# Patient Record
Sex: Female | Born: 1960 | Race: White | Hispanic: No | Marital: Single | Smoking: Never smoker
Health system: Southern US, Community
[De-identification: ages and names within clinical notes are randomized; demographics above are authoritative.]

## PROBLEM LIST (undated history)

## (undated) HISTORY — PX: AUGMENTATION MAMMAPLASTY: SUR837

---

## 2016-12-11 DIAGNOSIS — N952 Postmenopausal atrophic vaginitis: Secondary | ICD-10-CM | POA: Diagnosis not present

## 2016-12-11 DIAGNOSIS — R5383 Other fatigue: Secondary | ICD-10-CM | POA: Diagnosis not present

## 2016-12-18 DIAGNOSIS — E875 Hyperkalemia: Secondary | ICD-10-CM | POA: Diagnosis not present

## 2016-12-22 DIAGNOSIS — R0789 Other chest pain: Secondary | ICD-10-CM | POA: Diagnosis not present

## 2016-12-22 DIAGNOSIS — R0602 Shortness of breath: Secondary | ICD-10-CM | POA: Diagnosis not present

## 2016-12-22 DIAGNOSIS — M159 Polyosteoarthritis, unspecified: Secondary | ICD-10-CM | POA: Diagnosis not present

## 2016-12-26 DIAGNOSIS — R0789 Other chest pain: Secondary | ICD-10-CM | POA: Diagnosis not present

## 2016-12-26 DIAGNOSIS — R0602 Shortness of breath: Secondary | ICD-10-CM | POA: Diagnosis not present

## 2017-01-02 DIAGNOSIS — Z7689 Persons encountering health services in other specified circumstances: Secondary | ICD-10-CM | POA: Diagnosis not present

## 2017-01-02 DIAGNOSIS — R9439 Abnormal result of other cardiovascular function study: Secondary | ICD-10-CM | POA: Diagnosis not present

## 2017-01-07 DIAGNOSIS — R0602 Shortness of breath: Secondary | ICD-10-CM | POA: Diagnosis not present

## 2017-01-07 DIAGNOSIS — R5381 Other malaise: Secondary | ICD-10-CM | POA: Diagnosis not present

## 2017-01-07 DIAGNOSIS — R0789 Other chest pain: Secondary | ICD-10-CM | POA: Diagnosis not present

## 2017-05-29 DIAGNOSIS — M2550 Pain in unspecified joint: Secondary | ICD-10-CM | POA: Diagnosis not present

## 2017-05-29 DIAGNOSIS — R1013 Epigastric pain: Secondary | ICD-10-CM | POA: Diagnosis not present

## 2017-05-29 DIAGNOSIS — K219 Gastro-esophageal reflux disease without esophagitis: Secondary | ICD-10-CM | POA: Diagnosis not present

## 2017-05-29 DIAGNOSIS — R5383 Other fatigue: Secondary | ICD-10-CM | POA: Diagnosis not present

## 2017-06-04 DIAGNOSIS — S8002XA Contusion of left knee, initial encounter: Secondary | ICD-10-CM | POA: Diagnosis not present

## 2017-07-01 DIAGNOSIS — K219 Gastro-esophageal reflux disease without esophagitis: Secondary | ICD-10-CM | POA: Diagnosis not present

## 2017-07-01 DIAGNOSIS — E559 Vitamin D deficiency, unspecified: Secondary | ICD-10-CM | POA: Diagnosis not present

## 2017-08-12 DIAGNOSIS — H5789 Other specified disorders of eye and adnexa: Secondary | ICD-10-CM | POA: Diagnosis not present

## 2017-09-14 DIAGNOSIS — K219 Gastro-esophageal reflux disease without esophagitis: Secondary | ICD-10-CM | POA: Diagnosis not present

## 2017-09-14 DIAGNOSIS — Z23 Encounter for immunization: Secondary | ICD-10-CM | POA: Diagnosis not present

## 2017-09-14 DIAGNOSIS — E559 Vitamin D deficiency, unspecified: Secondary | ICD-10-CM | POA: Diagnosis not present

## 2018-04-12 ENCOUNTER — Other Ambulatory Visit: Payer: Self-pay | Admitting: Orthopedic Surgery

## 2018-04-12 DIAGNOSIS — M79672 Pain in left foot: Secondary | ICD-10-CM

## 2018-11-09 ENCOUNTER — Other Ambulatory Visit: Payer: Self-pay | Admitting: Family Medicine

## 2018-11-09 DIAGNOSIS — Z1231 Encounter for screening mammogram for malignant neoplasm of breast: Secondary | ICD-10-CM

## 2018-12-11 ENCOUNTER — Ambulatory Visit
Admission: RE | Admit: 2018-12-11 | Discharge: 2018-12-11 | Disposition: A | Discharge status: Home / Self Care | Payer: 59 | Source: Ambulatory Visit | Attending: Family Medicine | Admitting: Family Medicine

## 2018-12-11 ENCOUNTER — Other Ambulatory Visit: Payer: Self-pay

## 2018-12-11 DIAGNOSIS — Z1231 Encounter for screening mammogram for malignant neoplasm of breast: Secondary | ICD-10-CM

## 2019-01-25 ENCOUNTER — Encounter: Payer: 59 | Admitting: Plastic Surgery

## 2019-03-07 ENCOUNTER — Other Ambulatory Visit: Payer: Self-pay

## 2019-03-07 DIAGNOSIS — Z20822 Contact with and (suspected) exposure to covid-19: Secondary | ICD-10-CM

## 2019-03-08 LAB — NOVEL CORONAVIRUS, NAA: SARS-CoV-2, NAA: NOT DETECTED

## 2019-04-15 ENCOUNTER — Encounter: Payer: Self-pay | Admitting: Plastic Surgery

## 2019-06-02 ENCOUNTER — Ambulatory Visit (INDEPENDENT_AMBULATORY_CARE_PROVIDER_SITE_OTHER): Payer: Self-pay | Admitting: Plastic Surgery

## 2019-06-02 ENCOUNTER — Encounter

## 2019-06-02 ENCOUNTER — Other Ambulatory Visit: Payer: Self-pay

## 2019-06-02 ENCOUNTER — Encounter: Payer: Self-pay | Admitting: Plastic Surgery

## 2019-06-02 VITALS — BP 171/102 | HR 86 | Temp 97.7°F | Ht 62.0 in | Wt 161.0 lb

## 2019-06-02 DIAGNOSIS — Z411 Encounter for cosmetic surgery: Secondary | ICD-10-CM

## 2019-06-02 NOTE — Progress Notes (Signed)
Patient presents to be evaluated for Botox.  She is interested in Botox for the glabella the forehead and the crows feet and has both static and dynamic righted's in all these areas.  She has had Botox before and was happy with the results.  She said it lasted her about 3 months.  She is also concerned about her lower lip fullness.  40 units of Botox were used for injection today.  20 units were used for the corrugators and procerus 10 units were used in the central half of the forehead and 10 units were split between the crows feet.  She tolerated this well.  For the lower lip we discussed Cline Crock which she is going to think about.  I asked her to make appointment for 2 weeks in the event that she feels she needs any touchups.  Botox lot number is [id number] Expiration is 8/23

## 2019-06-16 ENCOUNTER — Other Ambulatory Visit: Payer: Self-pay

## 2019-06-16 ENCOUNTER — Ambulatory Visit (INDEPENDENT_AMBULATORY_CARE_PROVIDER_SITE_OTHER): Payer: Self-pay | Admitting: Plastic Surgery

## 2019-06-16 VITALS — BP 159/100 | HR 88 | Temp 97.6°F

## 2019-06-16 DIAGNOSIS — Z411 Encounter for cosmetic surgery: Secondary | ICD-10-CM

## 2019-06-16 NOTE — Progress Notes (Signed)
Patient presents for follow-up visit after getting Botox 2 weeks ago.  She is happy with her central forehead and glabella area.  She would like more Botox laterally because she does not like the arched appearance of her brow or the wrinkles that appear laterally when she activates the muscle.  She additionally would like a little bit more around her crows feet. 16 units of Botox were used today.  About 4 units were injected for the frontalis laterally on both sides and 4 units were injected on each side for the crows feet.  She tolerated this well.  She knows to call the office should she have any further concerns. Lot number for the Botox is [id number] 1 2 Expiration August 2023

## 2019-07-13 ENCOUNTER — Ambulatory Visit: Payer: 59 | Attending: Internal Medicine

## 2019-07-13 DIAGNOSIS — Z20822 Contact with and (suspected) exposure to covid-19: Secondary | ICD-10-CM

## 2019-07-14 LAB — NOVEL CORONAVIRUS, NAA: SARS-CoV-2, NAA: NOT DETECTED

## 2019-08-10 ENCOUNTER — Ambulatory Visit: Payer: 59 | Attending: Internal Medicine

## 2019-08-10 DIAGNOSIS — Z20822 Contact with and (suspected) exposure to covid-19: Secondary | ICD-10-CM

## 2019-08-11 LAB — NOVEL CORONAVIRUS, NAA: SARS-CoV-2, NAA: NOT DETECTED

## 2020-06-13 IMAGING — MG DIGITAL SCREENING BILATERAL MAMMOGRAM WITH IMPLANTS, CAD AND TOM
8 of 12 series · 8 of 28 positions shown · non-contrast
Comparison: Previous exam(s).

CLINICAL DATA: Screening.

EXAM:
DIGITAL SCREENING BILATERAL MAMMOGRAM WITH IMPLANTS, CAD AND TOMO
The patient has bilateral subpectoral saline implants. Standard and
implant displaced views were performed.

[L MLO]
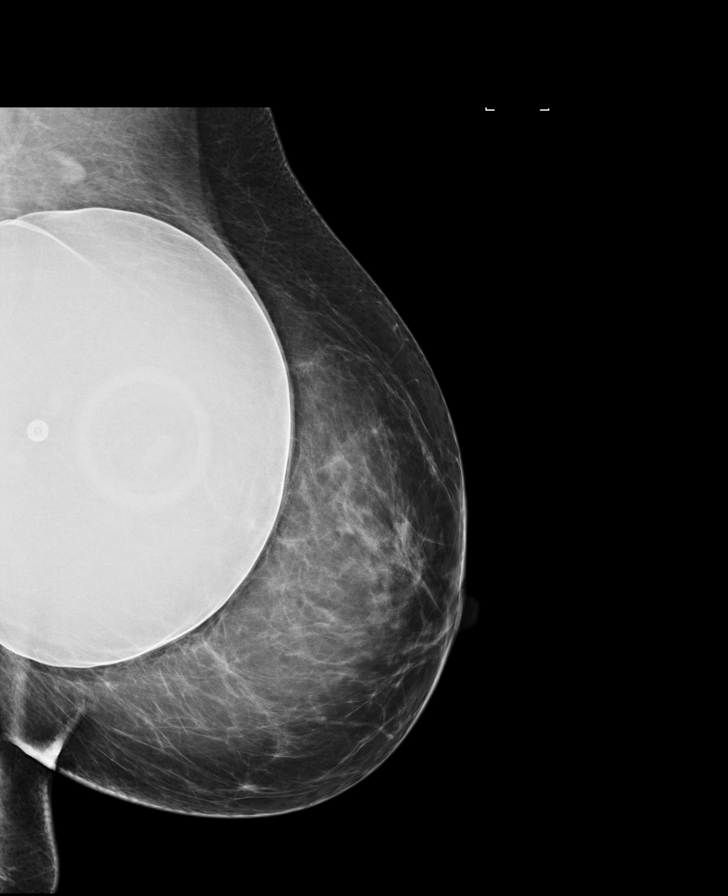

[L CC]
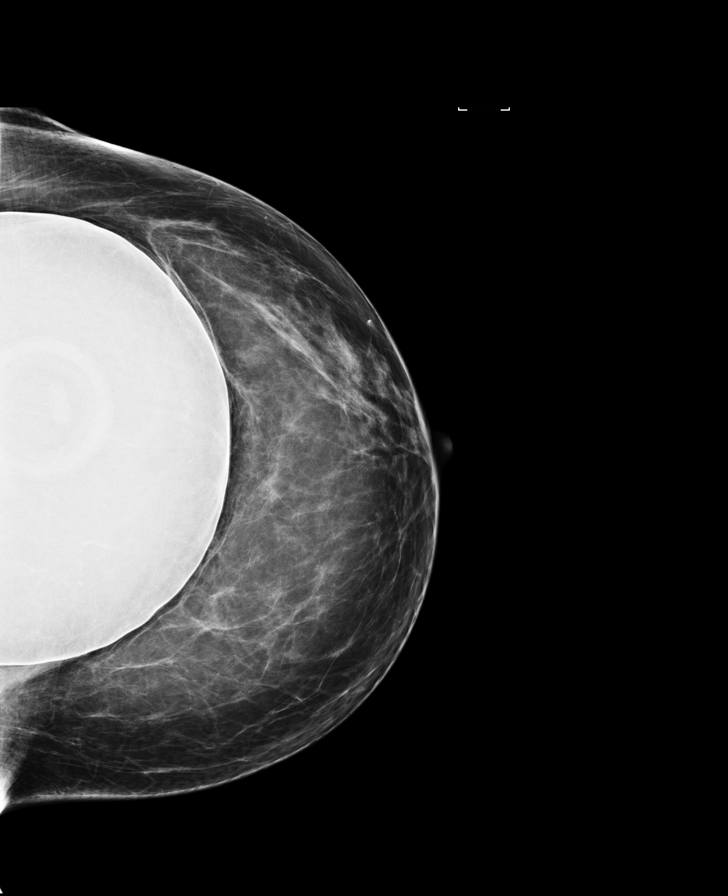

[R MLO]
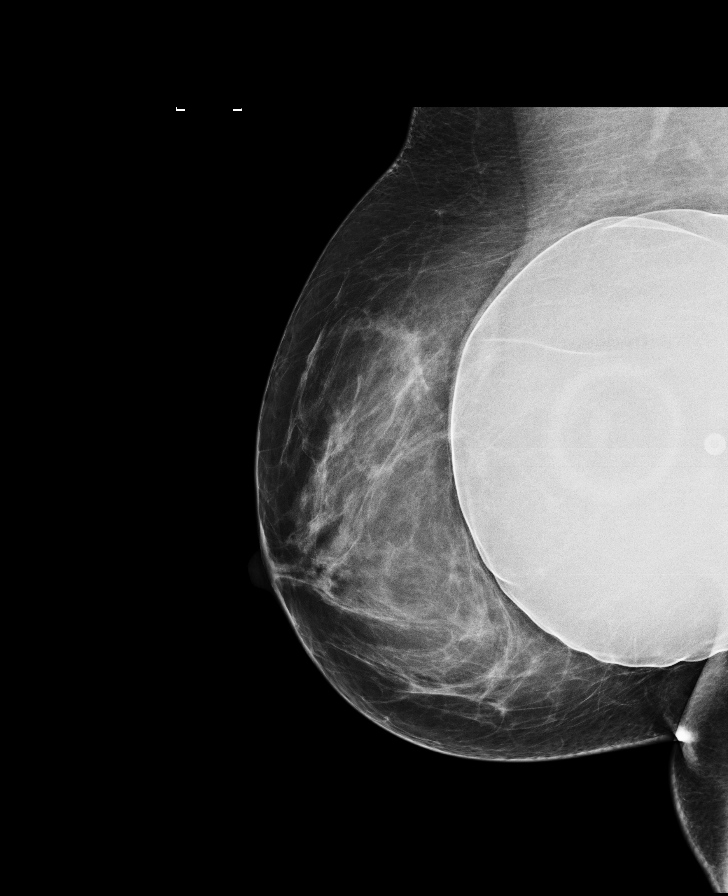

[R CC]
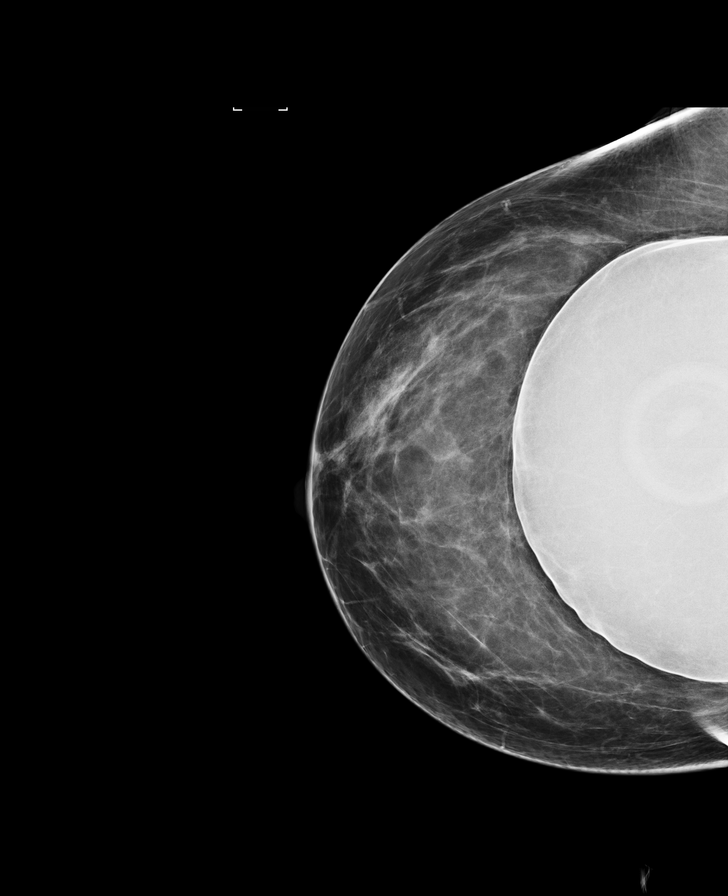

[R MLO synth-2D]
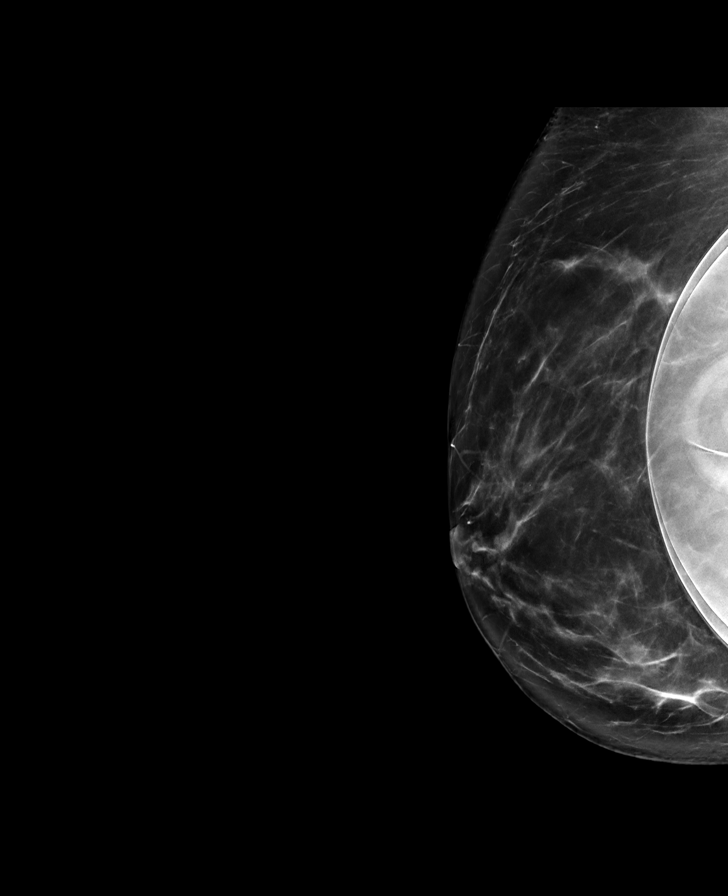

[R CC synth-2D]
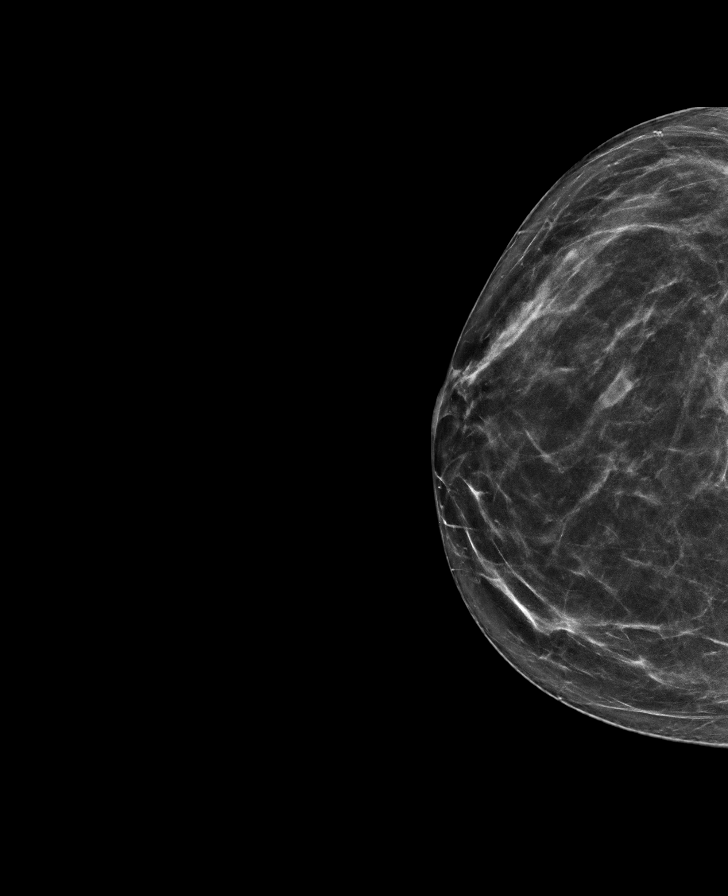

[L CC synth-2D]
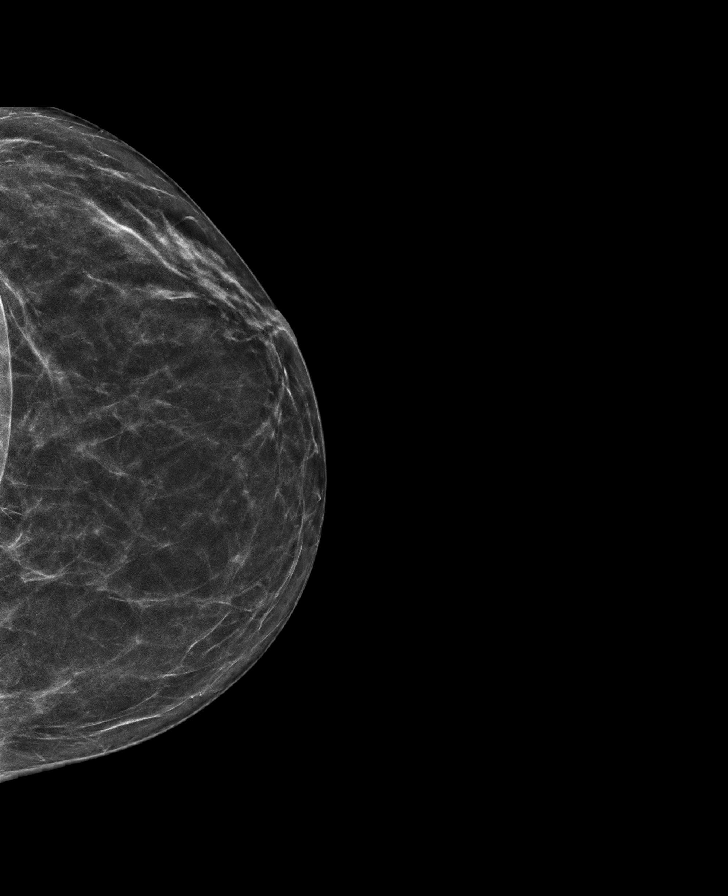

[L MLO synth-2D]
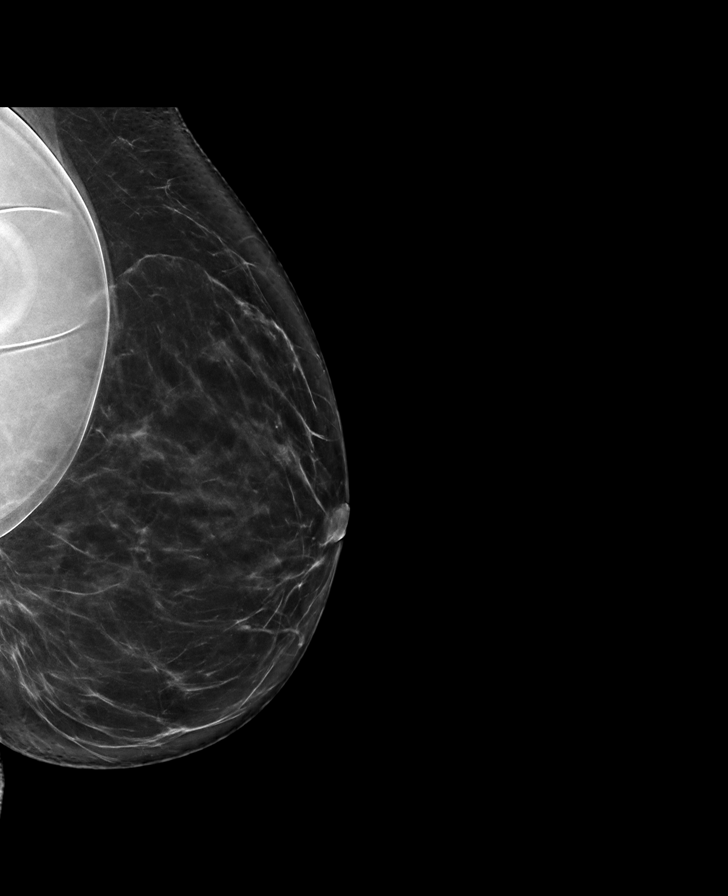

[8 of 28 positions shown; findings below may reference images not displayed]

ACR Breast Density Category b: There are scattered areas of
fibroglandular density.
FINDINGS: There are no findings suspicious for malignancy. Images were
processed with CAD.
IMPRESSION: No mammographic evidence of malignancy. A result letter of this
screening mammogram will be mailed directly to the patient.

RECOMMENDATION:
Screening mammogram in one year. (Code:[id number])

BI-RADS CATEGORY  1:  Negative.

## 2021-12-24 ENCOUNTER — Other Ambulatory Visit: Payer: Self-pay | Admitting: Physician Assistant

## 2021-12-24 DIAGNOSIS — Z1231 Encounter for screening mammogram for malignant neoplasm of breast: Secondary | ICD-10-CM

## 2022-01-09 ENCOUNTER — Ambulatory Visit: Payer: 59

## 2022-01-14 ENCOUNTER — Ambulatory Visit
Admission: RE | Admit: 2022-01-14 | Discharge: 2022-01-14 | Disposition: A | Discharge status: Home / Self Care | Payer: BC Managed Care – PPO | Source: Ambulatory Visit | Attending: Physician Assistant | Admitting: Physician Assistant

## 2022-01-14 DIAGNOSIS — Z1231 Encounter for screening mammogram for malignant neoplasm of breast: Secondary | ICD-10-CM

## 2022-01-17 ENCOUNTER — Other Ambulatory Visit: Payer: Self-pay | Admitting: Physician Assistant

## 2022-01-17 DIAGNOSIS — R928 Other abnormal and inconclusive findings on diagnostic imaging of breast: Secondary | ICD-10-CM

## 2022-01-22 ENCOUNTER — Ambulatory Visit
Admission: RE | Admit: 2022-01-22 | Discharge: 2022-01-22 | Disposition: A | Discharge status: Home / Self Care | Payer: BC Managed Care – PPO | Source: Ambulatory Visit | Attending: Physician Assistant | Admitting: Physician Assistant

## 2022-01-22 ENCOUNTER — Other Ambulatory Visit: Payer: Self-pay | Admitting: Physician Assistant

## 2022-01-22 DIAGNOSIS — R928 Other abnormal and inconclusive findings on diagnostic imaging of breast: Secondary | ICD-10-CM

## 2023-06-25 ENCOUNTER — Other Ambulatory Visit: Payer: Self-pay | Admitting: Internal Medicine

## 2023-06-25 DIAGNOSIS — Z1231 Encounter for screening mammogram for malignant neoplasm of breast: Secondary | ICD-10-CM

## 2023-07-15 ENCOUNTER — Ambulatory Visit: Payer: BC Managed Care – PPO

## 2023-07-15 ENCOUNTER — Ambulatory Visit
Admission: RE | Admit: 2023-07-15 | Discharge: 2023-07-15 | Disposition: A | Discharge status: Home / Self Care | Payer: BC Managed Care – PPO | Source: Ambulatory Visit | Attending: Internal Medicine | Admitting: Internal Medicine

## 2023-07-15 DIAGNOSIS — Z1231 Encounter for screening mammogram for malignant neoplasm of breast: Secondary | ICD-10-CM
